# Patient Record
Sex: Female | Born: 1965 | Race: White | Hispanic: No | Marital: Married | Smoking: Never smoker
Health system: Southern US, Community
[De-identification: ages and names within clinical notes are randomized; demographics above are authoritative.]

## PROBLEM LIST (undated history)

## (undated) DIAGNOSIS — K219 Gastro-esophageal reflux disease without esophagitis: Secondary | ICD-10-CM

## (undated) DIAGNOSIS — F329 Major depressive disorder, single episode, unspecified: Secondary | ICD-10-CM

## (undated) DIAGNOSIS — I10 Essential (primary) hypertension: Secondary | ICD-10-CM

## (undated) DIAGNOSIS — F32A Depression, unspecified: Secondary | ICD-10-CM

## (undated) DIAGNOSIS — F319 Bipolar disorder, unspecified: Secondary | ICD-10-CM

## (undated) DIAGNOSIS — N6320 Unspecified lump in the left breast, unspecified quadrant: Secondary | ICD-10-CM

## (undated) HISTORY — PX: CHOLECYSTECTOMY: SHX55

---
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
## 2004-05-23 ENCOUNTER — Other Ambulatory Visit: Admission: RE | Admit: 2004-05-23 | Discharge: 2004-05-23 | Payer: Self-pay | Admitting: Obstetrics and Gynecology

## 2004-11-26 ENCOUNTER — Encounter: Admission: RE | Admit: 2004-11-26 | Discharge: 2004-11-26 | Payer: Self-pay | Admitting: Family Medicine

## 2005-06-23 ENCOUNTER — Other Ambulatory Visit: Admission: RE | Admit: 2005-06-23 | Discharge: 2005-06-23 | Payer: Self-pay | Admitting: Obstetrics and Gynecology

## 2006-02-22 ENCOUNTER — Emergency Department (HOSPITAL_COMMUNITY): Admission: EM | Admit: 2006-02-22 | Discharge: 2006-02-23 | Payer: Self-pay | Admitting: Emergency Medicine

## 2006-03-06 ENCOUNTER — Inpatient Hospital Stay (HOSPITAL_COMMUNITY): Admission: RE | Admit: 2006-03-06 | Discharge: 2006-03-07 | Payer: Self-pay

## 2006-03-16 ENCOUNTER — Ambulatory Visit: Payer: Self-pay | Admitting: Gastroenterology

## 2006-07-19 ENCOUNTER — Encounter: Admission: RE | Admit: 2006-07-19 | Discharge: 2006-07-19 | Payer: Self-pay | Admitting: Obstetrics and Gynecology

## 2012-05-31 ENCOUNTER — Other Ambulatory Visit (HOSPITAL_COMMUNITY): Payer: Self-pay | Admitting: Obstetrics and Gynecology

## 2012-05-31 DIAGNOSIS — E049 Nontoxic goiter, unspecified: Secondary | ICD-10-CM

## 2012-06-02 ENCOUNTER — Ambulatory Visit (HOSPITAL_COMMUNITY)
Admission: RE | Admit: 2012-06-02 | Discharge: 2012-06-02 | Disposition: A | Discharge status: Home / Self Care | Payer: Self-pay | Source: Ambulatory Visit | Attending: Obstetrics and Gynecology | Admitting: Obstetrics and Gynecology

## 2012-06-02 DIAGNOSIS — E049 Nontoxic goiter, unspecified: Secondary | ICD-10-CM

## 2012-06-02 DIAGNOSIS — R1310 Dysphagia, unspecified: Secondary | ICD-10-CM | POA: Insufficient documentation

## 2012-06-02 DIAGNOSIS — E042 Nontoxic multinodular goiter: Secondary | ICD-10-CM | POA: Insufficient documentation

## 2012-06-21 ENCOUNTER — Ambulatory Visit
Admission: RE | Admit: 2012-06-21 | Discharge: 2012-06-21 | Disposition: A | Discharge status: Home / Self Care | Payer: No Typology Code available for payment source | Source: Ambulatory Visit | Attending: Gastroenterology | Admitting: Gastroenterology

## 2012-06-21 ENCOUNTER — Other Ambulatory Visit: Payer: Self-pay | Admitting: Gastroenterology

## 2012-06-21 DIAGNOSIS — F458 Other somatoform disorders: Secondary | ICD-10-CM

## 2013-09-07 ENCOUNTER — Other Ambulatory Visit: Payer: Self-pay | Admitting: Obstetrics and Gynecology

## 2013-09-07 DIAGNOSIS — R928 Other abnormal and inconclusive findings on diagnostic imaging of breast: Secondary | ICD-10-CM

## 2013-09-14 ENCOUNTER — Ambulatory Visit
Admission: RE | Admit: 2013-09-14 | Discharge: 2013-09-14 | Disposition: A | Discharge status: Home / Self Care | Payer: Self-pay | Source: Ambulatory Visit | Attending: Obstetrics and Gynecology | Admitting: Obstetrics and Gynecology

## 2013-09-14 DIAGNOSIS — R928 Other abnormal and inconclusive findings on diagnostic imaging of breast: Secondary | ICD-10-CM

## 2014-08-13 ENCOUNTER — Other Ambulatory Visit: Payer: Self-pay | Admitting: Obstetrics and Gynecology

## 2014-08-13 DIAGNOSIS — R921 Mammographic calcification found on diagnostic imaging of breast: Secondary | ICD-10-CM

## 2014-09-17 ENCOUNTER — Ambulatory Visit
Admission: RE | Admit: 2014-09-17 | Discharge: 2014-09-17 | Disposition: A | Discharge status: Home / Self Care | Payer: BC Managed Care – PPO | Source: Ambulatory Visit | Attending: Obstetrics and Gynecology | Admitting: Obstetrics and Gynecology

## 2014-09-17 DIAGNOSIS — R921 Mammographic calcification found on diagnostic imaging of breast: Secondary | ICD-10-CM

## 2015-05-03 ENCOUNTER — Other Ambulatory Visit: Payer: Self-pay | Admitting: Family Medicine

## 2015-05-03 DIAGNOSIS — R1031 Right lower quadrant pain: Secondary | ICD-10-CM

## 2015-05-08 ENCOUNTER — Ambulatory Visit
Admission: RE | Admit: 2015-05-08 | Discharge: 2015-05-08 | Disposition: A | Discharge status: Home / Self Care | Payer: BLUE CROSS/BLUE SHIELD | Source: Ambulatory Visit | Attending: Family Medicine | Admitting: Family Medicine

## 2015-05-08 DIAGNOSIS — R1031 Right lower quadrant pain: Secondary | ICD-10-CM

## 2015-05-08 MED ORDER — IOPAMIDOL (ISOVUE-300) INJECTION 61%
100.0000 mL | Freq: Once | INTRAVENOUS | Status: AC | PRN
  Administered 2015-05-08: 100 mL via INTRAVENOUS

## 2015-08-23 ENCOUNTER — Other Ambulatory Visit: Payer: Self-pay | Admitting: Obstetrics and Gynecology

## 2015-08-23 DIAGNOSIS — R921 Mammographic calcification found on diagnostic imaging of breast: Secondary | ICD-10-CM

## 2015-09-23 ENCOUNTER — Ambulatory Visit
Admission: RE | Admit: 2015-09-23 | Discharge: 2015-09-23 | Disposition: A | Discharge status: Home / Self Care | Payer: BLUE CROSS/BLUE SHIELD | Source: Ambulatory Visit | Attending: Obstetrics and Gynecology | Admitting: Obstetrics and Gynecology

## 2015-09-23 DIAGNOSIS — R921 Mammographic calcification found on diagnostic imaging of breast: Secondary | ICD-10-CM

## 2018-05-19 ENCOUNTER — Other Ambulatory Visit: Payer: Self-pay | Admitting: Obstetrics and Gynecology

## 2018-05-19 DIAGNOSIS — Z1231 Encounter for screening mammogram for malignant neoplasm of breast: Secondary | ICD-10-CM

## 2018-06-13 ENCOUNTER — Ambulatory Visit
Admission: RE | Admit: 2018-06-13 | Discharge: 2018-06-13 | Disposition: A | Discharge status: Home / Self Care | Payer: PRIVATE HEALTH INSURANCE | Source: Ambulatory Visit | Attending: Obstetrics and Gynecology | Admitting: Obstetrics and Gynecology

## 2018-06-13 DIAGNOSIS — Z1231 Encounter for screening mammogram for malignant neoplasm of breast: Secondary | ICD-10-CM

## 2019-08-09 ENCOUNTER — Other Ambulatory Visit: Payer: Self-pay | Admitting: Obstetrics and Gynecology

## 2019-08-09 DIAGNOSIS — Z1231 Encounter for screening mammogram for malignant neoplasm of breast: Secondary | ICD-10-CM

## 2019-09-27 ENCOUNTER — Other Ambulatory Visit: Payer: Self-pay

## 2019-09-27 ENCOUNTER — Ambulatory Visit
Admission: RE | Admit: 2019-09-27 | Discharge: 2019-09-27 | Disposition: A | Discharge status: Home / Self Care | Payer: PRIVATE HEALTH INSURANCE | Source: Ambulatory Visit | Attending: Obstetrics and Gynecology | Admitting: Obstetrics and Gynecology

## 2019-09-27 DIAGNOSIS — Z1231 Encounter for screening mammogram for malignant neoplasm of breast: Secondary | ICD-10-CM

## 2019-09-28 ENCOUNTER — Other Ambulatory Visit: Payer: Self-pay | Admitting: Obstetrics and Gynecology

## 2019-09-28 DIAGNOSIS — R928 Other abnormal and inconclusive findings on diagnostic imaging of breast: Secondary | ICD-10-CM

## 2019-10-03 ENCOUNTER — Other Ambulatory Visit: Payer: Self-pay

## 2019-10-03 ENCOUNTER — Ambulatory Visit
Admission: RE | Admit: 2019-10-03 | Discharge: 2019-10-03 | Disposition: A | Discharge status: Home / Self Care | Payer: PRIVATE HEALTH INSURANCE | Source: Ambulatory Visit | Attending: Obstetrics and Gynecology | Admitting: Obstetrics and Gynecology

## 2019-10-03 ENCOUNTER — Other Ambulatory Visit: Payer: Self-pay | Admitting: Obstetrics and Gynecology

## 2019-10-03 DIAGNOSIS — N6320 Unspecified lump in the left breast, unspecified quadrant: Secondary | ICD-10-CM

## 2019-10-03 DIAGNOSIS — R928 Other abnormal and inconclusive findings on diagnostic imaging of breast: Secondary | ICD-10-CM

## 2019-10-05 ENCOUNTER — Other Ambulatory Visit: Payer: Self-pay

## 2019-10-05 ENCOUNTER — Ambulatory Visit
Admission: RE | Admit: 2019-10-05 | Discharge: 2019-10-05 | Disposition: A | Discharge status: Home / Self Care | Payer: PRIVATE HEALTH INSURANCE | Source: Ambulatory Visit | Attending: Obstetrics and Gynecology | Admitting: Obstetrics and Gynecology

## 2019-10-05 DIAGNOSIS — N6320 Unspecified lump in the left breast, unspecified quadrant: Secondary | ICD-10-CM

## 2019-11-21 ENCOUNTER — Ambulatory Visit: Payer: Self-pay | Admitting: General Surgery

## 2019-11-21 DIAGNOSIS — N6092 Unspecified benign mammary dysplasia of left breast: Secondary | ICD-10-CM

## 2019-11-22 ENCOUNTER — Other Ambulatory Visit: Payer: Self-pay | Admitting: General Surgery

## 2019-11-22 DIAGNOSIS — N6092 Unspecified benign mammary dysplasia of left breast: Secondary | ICD-10-CM

## 2019-12-29 ENCOUNTER — Other Ambulatory Visit: Payer: Self-pay

## 2019-12-29 ENCOUNTER — Encounter (HOSPITAL_BASED_OUTPATIENT_CLINIC_OR_DEPARTMENT_OTHER): Payer: Self-pay | Admitting: General Surgery

## 2020-01-01 ENCOUNTER — Other Ambulatory Visit (HOSPITAL_COMMUNITY): Payer: PRIVATE HEALTH INSURANCE

## 2020-01-01 ENCOUNTER — Encounter (HOSPITAL_BASED_OUTPATIENT_CLINIC_OR_DEPARTMENT_OTHER)
Admission: RE | Admit: 2020-01-01 | Discharge: 2020-01-01 | Disposition: A | Discharge status: Home / Self Care | Payer: PRIVATE HEALTH INSURANCE | Source: Ambulatory Visit | Attending: General Surgery | Admitting: General Surgery

## 2020-01-01 DIAGNOSIS — I10 Essential (primary) hypertension: Secondary | ICD-10-CM | POA: Diagnosis not present

## 2020-01-01 DIAGNOSIS — R9431 Abnormal electrocardiogram [ECG] [EKG]: Secondary | ICD-10-CM | POA: Diagnosis not present

## 2020-01-01 DIAGNOSIS — Z01810 Encounter for preprocedural cardiovascular examination: Secondary | ICD-10-CM | POA: Diagnosis not present

## 2020-01-01 NOTE — Progress Notes (Signed)

## 2020-01-03 ENCOUNTER — Ambulatory Visit
Admission: RE | Admit: 2020-01-03 | Discharge: 2020-01-03 | Disposition: A | Discharge status: Home / Self Care | Payer: PRIVATE HEALTH INSURANCE | Source: Ambulatory Visit | Attending: General Surgery | Admitting: General Surgery

## 2020-01-03 ENCOUNTER — Other Ambulatory Visit: Payer: Self-pay

## 2020-01-03 DIAGNOSIS — N6092 Unspecified benign mammary dysplasia of left breast: Secondary | ICD-10-CM

## 2020-01-04 ENCOUNTER — Ambulatory Visit (HOSPITAL_BASED_OUTPATIENT_CLINIC_OR_DEPARTMENT_OTHER): Payer: PRIVATE HEALTH INSURANCE | Admitting: Certified Registered"

## 2020-01-04 ENCOUNTER — Encounter (HOSPITAL_BASED_OUTPATIENT_CLINIC_OR_DEPARTMENT_OTHER)
Admission: RE | Disposition: A | Discharge status: Home / Self Care | Payer: Self-pay | Source: Home / Self Care | Attending: General Surgery

## 2020-01-04 ENCOUNTER — Ambulatory Visit
Admission: RE | Admit: 2020-01-04 | Discharge: 2020-01-04 | Disposition: A | Discharge status: Home / Self Care | Payer: PRIVATE HEALTH INSURANCE | Source: Ambulatory Visit | Attending: General Surgery | Admitting: General Surgery

## 2020-01-04 ENCOUNTER — Encounter (HOSPITAL_BASED_OUTPATIENT_CLINIC_OR_DEPARTMENT_OTHER): Payer: Self-pay | Admitting: General Surgery

## 2020-01-04 ENCOUNTER — Ambulatory Visit (HOSPITAL_BASED_OUTPATIENT_CLINIC_OR_DEPARTMENT_OTHER)
Admission: RE | Admit: 2020-01-04 | Discharge: 2020-01-04 | Disposition: A | Discharge status: Home / Self Care | Payer: PRIVATE HEALTH INSURANCE | Attending: General Surgery | Admitting: General Surgery

## 2020-01-04 ENCOUNTER — Other Ambulatory Visit: Payer: Self-pay

## 2020-01-04 DIAGNOSIS — Z803 Family history of malignant neoplasm of breast: Secondary | ICD-10-CM | POA: Diagnosis not present

## 2020-01-04 DIAGNOSIS — Z79899 Other long term (current) drug therapy: Secondary | ICD-10-CM | POA: Diagnosis not present

## 2020-01-04 DIAGNOSIS — N6092 Unspecified benign mammary dysplasia of left breast: Secondary | ICD-10-CM | POA: Insufficient documentation

## 2020-01-04 DIAGNOSIS — D242 Benign neoplasm of left breast: Secondary | ICD-10-CM | POA: Diagnosis not present

## 2020-01-04 DIAGNOSIS — Z8249 Family history of ischemic heart disease and other diseases of the circulatory system: Secondary | ICD-10-CM | POA: Diagnosis not present

## 2020-01-04 DIAGNOSIS — I10 Essential (primary) hypertension: Secondary | ICD-10-CM | POA: Insufficient documentation

## 2020-01-04 DIAGNOSIS — F319 Bipolar disorder, unspecified: Secondary | ICD-10-CM | POA: Diagnosis not present

## 2020-01-04 DIAGNOSIS — Z885 Allergy status to narcotic agent status: Secondary | ICD-10-CM | POA: Diagnosis not present

## 2020-01-04 HISTORY — DX: Unspecified lump in the left breast, unspecified quadrant: N63.20

## 2020-01-04 HISTORY — DX: Gastro-esophageal reflux disease without esophagitis: K21.9

## 2020-01-04 HISTORY — DX: Depression, unspecified: F32.A

## 2020-01-04 HISTORY — PX: BREAST LUMPECTOMY WITH RADIOACTIVE SEED LOCALIZATION: SHX6424

## 2020-01-04 HISTORY — DX: Bipolar disorder, unspecified: F31.9

## 2020-01-04 HISTORY — DX: Essential (primary) hypertension: I10

## 2020-01-04 SURGERY — BREAST LUMPECTOMY WITH RADIOACTIVE SEED LOCALIZATION
Anesthesia: General | Site: Breast | Laterality: Left

## 2020-01-04 MED ORDER — CELECOXIB 200 MG PO CAPS
ORAL_CAPSULE | ORAL | Status: AC
  Filled 2020-01-04: qty 1

## 2020-01-04 MED ORDER — LACTATED RINGERS IV SOLN: INTRAVENOUS | Status: DC

## 2020-01-04 MED ORDER — MIDAZOLAM HCL 2 MG/2ML IJ SOLN
INTRAMUSCULAR | Status: AC
  Filled 2020-01-04: qty 2

## 2020-01-04 MED ORDER — DEXAMETHASONE SODIUM PHOSPHATE 10 MG/ML IJ SOLN
INTRAMUSCULAR | Status: DC | PRN
  Administered 2020-01-04: 10 mg via INTRAVENOUS

## 2020-01-04 MED ORDER — ROCURONIUM BROMIDE 10 MG/ML (PF) SYRINGE
PREFILLED_SYRINGE | INTRAVENOUS | Status: AC
  Filled 2020-01-04: qty 10

## 2020-01-04 MED ORDER — MIDAZOLAM HCL 5 MG/5ML IJ SOLN
INTRAMUSCULAR | Status: DC | PRN
  Administered 2020-01-04: 2 mg via INTRAVENOUS

## 2020-01-04 MED ORDER — LIDOCAINE HCL (CARDIAC) PF 100 MG/5ML IV SOSY
PREFILLED_SYRINGE | INTRAVENOUS | Status: DC | PRN
  Administered 2020-01-04: 100 mg via INTRAVENOUS

## 2020-01-04 MED ORDER — FENTANYL CITRATE (PF) 100 MCG/2ML IJ SOLN
INTRAMUSCULAR | Status: AC
  Filled 2020-01-04: qty 2

## 2020-01-04 MED ORDER — CEFAZOLIN SODIUM-DEXTROSE 2-4 GM/100ML-% IV SOLN
INTRAVENOUS | Status: AC
  Filled 2020-01-04: qty 100

## 2020-01-04 MED ORDER — CHLORHEXIDINE GLUCONATE CLOTH 2 % EX PADS: 6.0000 | MEDICATED_PAD | Freq: Once | CUTANEOUS | Status: DC

## 2020-01-04 MED ORDER — ONDANSETRON HCL 4 MG/2ML IJ SOLN
INTRAMUSCULAR | Status: AC
  Filled 2020-01-04: qty 2

## 2020-01-04 MED ORDER — ACETAMINOPHEN 500 MG PO TABS
1000.0000 mg | ORAL_TABLET | ORAL | Status: AC
  Administered 2020-01-04: 1000 mg via ORAL

## 2020-01-04 MED ORDER — GABAPENTIN 300 MG PO CAPS
ORAL_CAPSULE | ORAL | Status: AC
  Filled 2020-01-04: qty 1

## 2020-01-04 MED ORDER — BUPIVACAINE HCL (PF) 0.25 % IJ SOLN
INTRAMUSCULAR | Status: AC
  Filled 2020-01-04: qty 30

## 2020-01-04 MED ORDER — FENTANYL CITRATE (PF) 100 MCG/2ML IJ SOLN: 50.0000 ug | INTRAMUSCULAR | Status: DC | PRN

## 2020-01-04 MED ORDER — CELECOXIB 200 MG PO CAPS
200.0000 mg | ORAL_CAPSULE | ORAL | Status: AC
  Administered 2020-01-04: 200 mg via ORAL

## 2020-01-04 MED ORDER — EPHEDRINE SULFATE 50 MG/ML IJ SOLN
INTRAMUSCULAR | Status: DC | PRN
  Administered 2020-01-04: 5 mg via INTRAVENOUS
  Administered 2020-01-04: 10 mg via INTRAVENOUS
  Administered 2020-01-04: 5 mg via INTRAVENOUS

## 2020-01-04 MED ORDER — GABAPENTIN 300 MG PO CAPS
300.0000 mg | ORAL_CAPSULE | ORAL | Status: AC
  Administered 2020-01-04: 300 mg via ORAL

## 2020-01-04 MED ORDER — PHENYLEPHRINE 40 MCG/ML (10ML) SYRINGE FOR IV PUSH (FOR BLOOD PRESSURE SUPPORT)
PREFILLED_SYRINGE | INTRAVENOUS | Status: AC
  Filled 2020-01-04: qty 10

## 2020-01-04 MED ORDER — ONDANSETRON HCL 4 MG/2ML IJ SOLN
INTRAMUSCULAR | Status: DC | PRN
  Administered 2020-01-04: 4 mg via INTRAVENOUS

## 2020-01-04 MED ORDER — ACETAMINOPHEN 500 MG PO TABS
ORAL_TABLET | ORAL | Status: AC
  Filled 2020-01-04: qty 2

## 2020-01-04 MED ORDER — ONDANSETRON HCL 4 MG/2ML IJ SOLN
INTRAMUSCULAR | Status: AC
  Filled 2020-01-04: qty 4

## 2020-01-04 MED ORDER — MIDAZOLAM HCL 2 MG/2ML IJ SOLN: 1.0000 mg | INTRAMUSCULAR | Status: DC | PRN

## 2020-01-04 MED ORDER — FENTANYL CITRATE (PF) 100 MCG/2ML IJ SOLN
INTRAMUSCULAR | Status: DC | PRN
  Administered 2020-01-04: 100 ug via INTRAVENOUS

## 2020-01-04 MED ORDER — 0.9 % SODIUM CHLORIDE (POUR BTL) OPTIME
TOPICAL | Status: DC | PRN
  Administered 2020-01-04: 1000 mL

## 2020-01-04 MED ORDER — CEFAZOLIN SODIUM-DEXTROSE 2-4 GM/100ML-% IV SOLN
2.0000 g | INTRAVENOUS | Status: AC
  Administered 2020-01-04: 2 g via INTRAVENOUS

## 2020-01-04 MED ORDER — PROPOFOL 10 MG/ML IV BOLUS
INTRAVENOUS | Status: DC | PRN
  Administered 2020-01-04: 150 mg via INTRAVENOUS

## 2020-01-04 MED ORDER — EPHEDRINE 5 MG/ML INJ
INTRAVENOUS | Status: AC
  Filled 2020-01-04: qty 10

## 2020-01-04 MED ORDER — BUPIVACAINE HCL 0.25 % IJ SOLN
INTRAMUSCULAR | Status: DC | PRN
  Administered 2020-01-04: 20 mL

## 2020-01-04 MED ORDER — TRAMADOL HCL 50 MG PO TABS: 50.0000 mg | ORAL_TABLET | Freq: Four times a day (QID) | ORAL | 0 refills | Status: AC | PRN

## 2020-01-04 MED ORDER — DEXTROSE 50 % IV SOLN
INTRAVENOUS | Status: AC
  Filled 2020-01-04: qty 50

## 2020-01-04 SURGICAL SUPPLY — 43 items
ADH SKN CLS APL DERMABOND .7 (GAUZE/BANDAGES/DRESSINGS) ×1
APL PRP STRL LF DISP 70% ISPRP (MISCELLANEOUS) ×1
APPLIER CLIP 9.375 MED OPEN (MISCELLANEOUS)
APR CLP MED 9.3 20 MLT OPN (MISCELLANEOUS)
BLADE SURG 15 STRL LF DISP TIS (BLADE) ×1 IMPLANT
BLADE SURG 15 STRL SS (BLADE) ×3
CANISTER SUC SOCK COL 7IN (MISCELLANEOUS) ×3 IMPLANT
CANISTER SUCT 1200ML W/VALVE (MISCELLANEOUS) ×3 IMPLANT
CHLORAPREP W/TINT 26 (MISCELLANEOUS) ×3 IMPLANT
CLIP APPLIE 9.375 MED OPEN (MISCELLANEOUS) IMPLANT
COVER BACK TABLE 60X90IN (DRAPES) ×3 IMPLANT
COVER MAYO STAND STRL (DRAPES) ×3 IMPLANT
COVER PROBE W GEL 5X96 (DRAPES) ×3 IMPLANT
COVER WAND RF STERILE (DRAPES) IMPLANT
DECANTER SPIKE VIAL GLASS SM (MISCELLANEOUS) IMPLANT
DERMABOND ADVANCED (GAUZE/BANDAGES/DRESSINGS) ×2
DERMABOND ADVANCED .7 DNX12 (GAUZE/BANDAGES/DRESSINGS) ×1 IMPLANT
DRAPE LAPAROSCOPIC ABDOMINAL (DRAPES) ×3 IMPLANT
DRAPE UTILITY XL STRL (DRAPES) ×3 IMPLANT
ELECT COATED BLADE 2.86 ST (ELECTRODE) ×3 IMPLANT
ELECT REM PT RETURN 9FT ADLT (ELECTROSURGICAL) ×3
ELECTRODE REM PT RTRN 9FT ADLT (ELECTROSURGICAL) ×1 IMPLANT
GLOVE BIO SURGEON STRL SZ7.5 (GLOVE) ×6 IMPLANT
GOWN STRL REUS W/ TWL LRG LVL3 (GOWN DISPOSABLE) ×2 IMPLANT
GOWN STRL REUS W/TWL LRG LVL3 (GOWN DISPOSABLE) ×6
ILLUMINATOR WAVEGUIDE N/F (MISCELLANEOUS) IMPLANT
KIT MARKER MARGIN INK (KITS) ×3 IMPLANT
LIGHT WAVEGUIDE WIDE FLAT (MISCELLANEOUS) IMPLANT
NEEDLE HYPO 25X1 1.5 SAFETY (NEEDLE) IMPLANT
NS IRRIG 1000ML POUR BTL (IV SOLUTION) IMPLANT
PACK BASIN DAY SURGERY FS (CUSTOM PROCEDURE TRAY) ×3 IMPLANT
PENCIL SMOKE EVACUATOR (MISCELLANEOUS) ×3 IMPLANT
SLEEVE SCD COMPRESS KNEE MED (MISCELLANEOUS) ×3 IMPLANT
SPONGE LAP 18X18 RF (DISPOSABLE) ×3 IMPLANT
SUT MON AB 4-0 PC3 18 (SUTURE) ×3 IMPLANT
SUT SILK 2 0 SH (SUTURE) IMPLANT
SUT VICRYL 3-0 CR8 SH (SUTURE) ×3 IMPLANT
SYR CONTROL 10ML LL (SYRINGE) IMPLANT
TOWEL GREEN STERILE FF (TOWEL DISPOSABLE) ×3 IMPLANT
TRAY FAXITRON CT DISP (TRAY / TRAY PROCEDURE) ×3 IMPLANT
TUBE CONNECTING 20'X1/4 (TUBING) ×1
TUBE CONNECTING 20X1/4 (TUBING) ×2 IMPLANT
YANKAUER SUCT BULB TIP NO VENT (SUCTIONS) IMPLANT

## 2020-01-04 NOTE — Transfer of Care (Signed)
Immediate Anesthesia Transfer of Care Note  Patient: Sara Ortiz  Procedure(s) Performed: RADIOACTIVE SEED GUIDED LEFT BREAST LUMPECTOMY (Left Breast)  Patient Location: PACU  Anesthesia Type:General  Level of Consciousness: awake, alert  and oriented  Airway & Oxygen Therapy: Patient Spontanous Breathing and Patient connected to face mask oxygen  Post-op Assessment: Report given to RN and Post -op Vital signs reviewed and stable  Post vital signs: Reviewed and stable  Last Vitals:  Vitals Value Taken Time  BP 133/77 01/04/20 1000  Temp    Pulse 100 01/04/20 1000  Resp 17 01/04/20 1000  SpO2 100 % 01/04/20 1000  Vitals shown include unvalidated device data.  Last Pain:  Vitals:   01/04/20 0812  TempSrc: Tympanic  PainSc: 0-No pain      Patients Stated Pain Goal: 8 ([id number])  Complications: No apparent anesthesia complications

## 2020-01-04 NOTE — H&P (Signed)
Sara Ortiz  Location: [facility] Surgery Patient #: [id number] DOB: [birth year] Married / Language: English / Race: White Female   History of Present Illness  The patient is a 54 year old female who presents with a breast mass. We are asked to see the patient in consultation by Dr. Louretta Shorten to evaluate her for a mass in the left breast. The patient is a 54 year old white female who recently went for a routine screening mammogram. At that time she was found to have a 9 mm mass in the upper outer quadrant of the left breast. Her mammograms were reviewed. The mass was biopsied and came back as a fibroepithelial lesion with epithelial atypia consistent with atypical duct hyperplasia. She denies any breast pain or discharge from the nipple. Her only family history of breast cancer is in a paternal aunt. She does not smoke. She does not take hormone replacement. She is otherwise in good health.   Past Surgical History  Breast Biopsy  Left. Cesarean Section - 1  Gallbladder Surgery - Laparoscopic   Diagnostic Studies History Colonoscopy  1-5 years ago Mammogram  within last year Pap Smear  1-5 years ago  Allergies  CODEINE   Medication History  lamoTRIgine (100MG  Tablet, Oral) Active. Losartan Potassium (100MG  Tablet, Oral) Active. Medications Reconciled  Social History  Alcohol use  Occasional alcohol use. Caffeine use  Coffee. No drug use  Tobacco use  Never smoker.  Family History  Alcohol Abuse  Father. Bleeding disorder  Family Members In General. Depression  Mother, Sister. Heart Disease  Family Members In General, Father. Hypertension  Father, Mother. Migraine Headache  Mother, Sister.  Pregnancy / Birth History  Age at menarche  36 years. Age of menopause  [age] Contraceptive History  Intrauterine device, Oral contraceptives. Gravida  1 Irregular periods  Length (months) of breastfeeding  7-12 Maternal age  93-25 Para   1  Other Problems  Anxiety Disorder  Cholelithiasis  Depression  Gastroesophageal Reflux Disease  Hemorrhoids  High blood pressure  Kidney Stone  Migraine Headache     Review of Systems  General Not Present- Appetite Loss, Chills, Fatigue, Fever, Night Sweats, Weight Gain and Weight Loss. HEENT Present- Wears glasses/contact lenses. Not Present- Earache, Hearing Loss, Hoarseness, Nose Bleed, Oral Ulcers, Ringing in the Ears, Seasonal Allergies, Sinus Pain, Sore Throat, Visual Disturbances and Yellow Eyes. Respiratory Not Present- Bloody sputum, Chronic Cough, Difficulty Breathing, Snoring and Wheezing. Breast Present- Breast Mass. Not Present- Breast Pain, Nipple Discharge and Skin Changes. Cardiovascular Not Present- Chest Pain, Difficulty Breathing Lying Down, Leg Cramps, Palpitations, Rapid Heart Rate, Shortness of Breath and Swelling of Extremities. Gastrointestinal Not Present- Abdominal Pain, Bloating, Bloody Stool, Change in Bowel Habits, Chronic diarrhea, Constipation, Difficulty Swallowing, Excessive gas, Gets full quickly at meals, Hemorrhoids, Indigestion, Nausea, Rectal Pain and Vomiting. Female Genitourinary Not Present- Frequency, Nocturia, Painful Urination, Pelvic Pain and Urgency. Musculoskeletal Not Present- Back Pain, Joint Pain, Joint Stiffness, Muscle Pain, Muscle Weakness and Swelling of Extremities. Neurological Not Present- Decreased Memory, Fainting, Headaches, Numbness, Seizures, Tingling, Tremor, Trouble walking and Weakness. Psychiatric Present- Depression. Not Present- Anxiety, Bipolar, Change in Sleep Pattern, Fearful and Frequent crying. Endocrine Present- Hot flashes. Not Present- Cold Intolerance, Excessive Hunger, Hair Changes, Heat Intolerance and New Diabetes. Hematology Not Present- Blood Thinners, Easy Bruising, Excessive bleeding, Gland problems, HIV and Persistent Infections.  Vitals Weight: 183.5 lb Height: 64in Body Surface Area:  1.89 m Body Mass Index: 31.5 kg/m  Temp.: 97.53F  Pulse: 94 (Regular)  P.OX: 99% (Room air) BP: 124/78 (Sitting, Left Arm, Standard)       Physical Exam General Mental Status-Alert. General Appearance-Consistent with stated age. Hydration-Well hydrated. Voice-Normal.  Head and Neck Head-normocephalic, atraumatic with no lesions or palpable masses. Trachea-midline. Thyroid Gland Characteristics - normal size and consistency.  Eye Eyeball - Bilateral-Extraocular movements intact. Sclera/Conjunctiva - Bilateral-No scleral icterus.  Chest and Lung Exam Chest and lung exam reveals -quiet, even and easy respiratory effort with no use of accessory muscles and on auscultation, normal breath sounds, no adventitious sounds and normal vocal resonance. Inspection Chest Wall - Normal. Back - normal.  Breast Note: There is no palpable mass in either breast. There is no palpable axillary, supraclavicular, or cervical lymphadenopathy.   Cardiovascular Cardiovascular examination reveals -normal heart sounds, regular rate and rhythm with no murmurs and normal pedal pulses bilaterally.  Abdomen Inspection Inspection of the abdomen reveals - No Hernias. Skin - Scar - no surgical scars. Palpation/Percussion Palpation and Percussion of the abdomen reveal - Soft, Non Tender, No Rebound tenderness, No Rigidity (guarding) and No hepatosplenomegaly. Auscultation Auscultation of the abdomen reveals - Bowel sounds normal.  Neurologic Neurologic evaluation reveals -alert and oriented x 3 with no impairment of recent or remote memory. Mental Status-Normal.  Musculoskeletal Normal Exam - Left-Upper Extremity Strength Normal and Lower Extremity Strength Normal. Normal Exam - Right-Upper Extremity Strength Normal and Lower Extremity Strength Normal.  Lymphatic Head & Neck  General Head & Neck Lymphatics: Bilateral - Description -  Normal. Axillary  General Axillary Region: Bilateral - Description - Normal. Tenderness - Non Tender. Femoral & Inguinal  Generalized Femoral & Inguinal Lymphatics: Bilateral - Description - Normal. Tenderness - Non Tender.    Assessment & Plan  ATYPICAL DUCTAL HYPERPLASIA OF LEFT BREAST (N60.92) Impression: The patient appears to have a 9 mm area of atypical ductal hyperplasia in the upper outer quadrant of the left breast. Because of its appearance and because it is considered a high risk lesion I would recommend that this area be removed. She would also like to have this done. I have discussed with her in detail the risks and benefits of the operation as well as some of the technical aspects including the use of a radioactive seed for localization and she understands and wishes to proceed. Since this diagnosis carries a lifetime breast cancer risk of around 30% I will also refer her to the high-risk clinic at the cancer center to talk about risk reduction. Given this level and wrist she will also likely be a candidate for yearly MRI as part of her screening imaging Current Plans Referred to Oncology, for evaluation and follow up (Oncology). Routine.

## 2020-01-04 NOTE — Discharge Instructions (Signed)
°  Post Anesthesia Home Care Instructions  Activity: Get plenty of rest for the remainder of the day. A responsible individual must stay with you for 24 hours following the procedure.  For the next 24 hours, DO NOT: -Drive a car -Paediatric nurse -Drink alcoholic beverages -Take any medication unless instructed by your physician -Make any legal decisions or sign important papers.  Meals: Start with liquid foods such as gelatin or soup. Progress to regular foods as tolerated. Avoid greasy, spicy, heavy foods. If nausea and/or vomiting occur, drink only clear liquids until the nausea and/or vomiting subsides. Call your physician if vomiting continues.  Special Instructions/Symptoms: Your throat may feel dry or sore from the anesthesia or the breathing tube placed in your throat during surgery. If this causes discomfort, gargle with warm salt water. The discomfort should disappear within 24 hours.  If you had a scopolamine patch placed behind your ear for the management of post- operative nausea and/or vomiting:  1. The medication in the patch is effective for 72 hours, after which it should be removed.  Wrap patch in a tissue and discard in the trash. Wash hands thoroughly with soap and water. 2. You may remove the patch earlier than 72 hours if you experience unpleasant side effects which may include dry mouth, dizziness or visual disturbances. 3. Avoid touching the patch. Wash your hands with soap and water after contact with the patch.     Call your surgeon if you experience:   1.  Fever over 101.0. 2.  Inability to urinate. 3.  Nausea and/or vomiting. 4.  Extreme swelling or bruising at the surgical site. 5.  Continued bleeding from the incision. 6.  Increased pain, redness or drainage from the incision. 7.  Problems related to your pain medication. 8.  Any problems and/or concerns  May take Tylenol after 2pm May take Ibuprofen after 4pm

## 2020-01-04 NOTE — Op Note (Signed)
01/04/2020  9:50 AM  PATIENT:  Sara Ortiz  54 y.o. female  PRE-OPERATIVE DIAGNOSIS:  LEFT BREAST ATYPICAL DUCTAL HYPERPLASIA  POST-OPERATIVE DIAGNOSIS:  LEFT BREAST ATYPICAL DUCTAL HYPERPLASIA  PROCEDURE:  Procedure(s): RADIOACTIVE SEED GUIDED LEFT BREAST LUMPECTOMY (Left)  SURGEON:  Surgeon(s) and Role:    * Jovita Kussmaul, MD - Primary  PHYSICIAN ASSISTANT:   ASSISTANTS: none   ANESTHESIA:   local and general  EBL:  5 mL   BLOOD ADMINISTERED:none  DRAINS: none   LOCAL MEDICATIONS USED:  MARCAINE     SPECIMEN:  Source of Specimen:  left breast tissue  DISPOSITION OF SPECIMEN:  PATHOLOGY  COUNTS:  YES  TOURNIQUET:  * No tourniquets in log *  DICTATION: .Dragon Dictation   After informed consent was obtained the patient was brought to the operating room and placed in the supine position on the operating table.  After adequate induction of general anesthesia the patient's left breast was prepped with ChloraPrep, allowed to dry, and draped in usual sterile manner.  An appropriate timeout was performed.  Previously an I-125 seed was placed in the lateral aspect of the left breast to mark an area of atypical ductal hyperplasia.  The neoprobe was set to I-125 in the area of radioactivity was readily identified.  The area around this was infiltrated with quarter percent Marcaine.  A small vertically oriented curvilinear incision was made along the outer edge of the breast with a 15 blade knife.  The incision was carried through the skin and subcutaneous tissue sharply with the electrocautery.  Dissection was then carried out through the breast tissue towards the radioactive seed under the direction of the neoprobe.  Once I more closely approached the radioactive seed I then removed a circular portion of breast tissue sharply with the electrocautery around the radioactive seed while checking the area of radioactivity frequently.  Once the specimen was removed it was oriented with  the appropriate paint colors.  A specimen radiograph was obtained that showed the clip and seed to be near the center of the specimen.  The specimen was then sent to pathology for further evaluation.  Hemostasis was achieved using the Bovie electrocautery.  The wound was irrigated with saline and infiltrated with more quarter percent Marcaine.  The deep layer of the wound was then closed with layers of interrupted 3-0 Vicryl stitches.  The skin was then closed with a running 4-0 Monocryl subcuticular stitch.  Dermabond dressings were applied.  The patient tolerated the procedure well.  At the end of the case all needle sponge and instrument counts were correct.  The patient was then awakened and taken to recovery in stable condition.  PLAN OF CARE: Discharge to home after PACU  PATIENT DISPOSITION:  PACU - hemodynamically stable.   Delay start of Pharmacological VTE agent (>24hrs) due to surgical blood loss or risk of bleeding: not applicable

## 2020-01-04 NOTE — Interval H&P Note (Signed)
History and Physical Interval Note:  01/04/2020 8:32 AM  Sara Ortiz  has presented today for surgery, with the diagnosis of LEFT BREAST ATYPICAL DUCTAL HYPERPLASIA.  The various methods of treatment have been discussed with the patient and family. After consideration of risks, benefits and other options for treatment, the patient has consented to  Procedure(s): LEFT BREAST LUMPECTOMY WITH RADIOACTIVE SEED LOCALIZATION (Left) as a surgical intervention.  The patient's history has been reviewed, patient examined, no change in status, stable for surgery.  I have reviewed the patient's chart and labs.  Questions were answered to the patient's satisfaction.     Autumn Messing III

## 2020-01-04 NOTE — Anesthesia Preprocedure Evaluation (Signed)
Anesthesia Evaluation  Patient identified by MRN, date of birth, ID band Patient awake    Reviewed: Allergy & Precautions, NPO status , Patient's Chart, lab work & pertinent test results  Airway Mallampati: II  TM Distance: >3 FB Neck ROM: Full    Dental no notable dental hx.    Pulmonary neg pulmonary ROS,    Pulmonary exam normal breath sounds clear to auscultation       Cardiovascular hypertension, Pt. on medications Normal cardiovascular exam Rhythm:Regular Rate:Normal     Neuro/Psych PSYCHIATRIC DISORDERS Depression Bipolar Disorder negative neurological ROS     GI/Hepatic Neg liver ROS, GERD  ,  Endo/Other  negative endocrine ROS  Renal/GU negative Renal ROS     Musculoskeletal negative musculoskeletal ROS (+)   Abdominal (+) + obese,   Peds  Hematology negative hematology ROS (+)   Anesthesia Other Findings   Reproductive/Obstetrics negative OB ROS                             Anesthesia Physical Anesthesia Plan  ASA: II  Anesthesia Plan: General   Post-op Pain Management:    Induction: Intravenous  PONV Risk Score and Plan: 4 or greater and Ondansetron, Dexamethasone, Midazolam, Scopolamine patch - Pre-op and Treatment may vary due to age or medical condition  Airway Management Planned: LMA  Additional Equipment:   Intra-op Plan:   Post-operative Plan: Extubation in OR  Informed Consent: I have reviewed the patients History and Physical, chart, labs and discussed the procedure including the risks, benefits and alternatives for the proposed anesthesia with the patient or authorized representative who has indicated his/her understanding and acceptance.     Dental advisory given  Plan Discussed with: CRNA  Anesthesia Plan Comments:         Anesthesia Quick Evaluation

## 2020-01-04 NOTE — Anesthesia Postprocedure Evaluation (Signed)
Anesthesia Post Note  Patient: Sara Ortiz  Procedure(s) Performed: RADIOACTIVE SEED GUIDED LEFT BREAST LUMPECTOMY (Left Breast)     Patient location during evaluation: PACU Anesthesia Type: General Level of consciousness: sedated and patient cooperative Pain management: pain level controlled Vital Signs Assessment: post-procedure vital signs reviewed and stable Respiratory status: spontaneous breathing Cardiovascular status: stable Anesthetic complications: no    Last Vitals:  Vitals:   01/04/20 1030 01/04/20 1051  BP: 138/80 140/80  Pulse: 76 84  Resp: 15 18  Temp:  (!) 36.3 C  SpO2: 100% 100%    Last Pain:  Vitals:   01/04/20 1051  TempSrc: Oral  PainSc: 0-No pain                 Nolon Nations

## 2020-01-04 NOTE — Anesthesia Procedure Notes (Addendum)
Procedure Name: LMA Insertion Date/Time: 01/04/2020 9:20 AM Performed by: Marrianne Mood, CRNA Pre-anesthesia Checklist: Patient identified, Emergency Drugs available, Suction available, Patient being monitored and Timeout performed Patient Re-evaluated:Patient Re-evaluated prior to induction Oxygen Delivery Method: Circle system utilized Preoxygenation: Pre-oxygenation with 100% oxygen Induction Type: IV induction Ventilation: Mask ventilation without difficulty LMA: LMA inserted LMA Size: 4.0 Number of attempts: 1 Airway Equipment and Method: Bite block Placement Confirmation: positive ETCO2 Tube secured with: Tape Dental Injury: Teeth and Oropharynx as per pre-operative assessment

## 2020-01-05 ENCOUNTER — Encounter: Payer: Self-pay | Admitting: *Deleted

## 2020-01-05 LAB — SURGICAL PATHOLOGY

## 2020-11-20 ENCOUNTER — Encounter (HOSPITAL_COMMUNITY): Payer: Self-pay

## 2021-08-06 ENCOUNTER — Other Ambulatory Visit: Payer: Self-pay | Admitting: Obstetrics and Gynecology

## 2021-08-06 DIAGNOSIS — Z1231 Encounter for screening mammogram for malignant neoplasm of breast: Secondary | ICD-10-CM

## 2021-09-04 ENCOUNTER — Ambulatory Visit
Admission: RE | Admit: 2021-09-04 | Discharge: 2021-09-04 | Disposition: A | Discharge status: Home / Self Care | Payer: PRIVATE HEALTH INSURANCE | Source: Ambulatory Visit | Attending: Obstetrics and Gynecology | Admitting: Obstetrics and Gynecology

## 2021-09-04 DIAGNOSIS — Z1231 Encounter for screening mammogram for malignant neoplasm of breast: Secondary | ICD-10-CM

## 2022-08-10 ENCOUNTER — Other Ambulatory Visit: Payer: Self-pay | Admitting: Obstetrics and Gynecology

## 2022-08-10 DIAGNOSIS — Z1231 Encounter for screening mammogram for malignant neoplasm of breast: Secondary | ICD-10-CM

## 2022-09-24 ENCOUNTER — Ambulatory Visit
Admission: RE | Admit: 2022-09-24 | Discharge: 2022-09-24 | Disposition: A | Discharge status: Home / Self Care | Payer: PRIVATE HEALTH INSURANCE | Source: Ambulatory Visit | Attending: Obstetrics and Gynecology | Admitting: Obstetrics and Gynecology

## 2022-09-24 DIAGNOSIS — Z1231 Encounter for screening mammogram for malignant neoplasm of breast: Secondary | ICD-10-CM

## 2023-01-27 IMAGING — MG MM DIGITAL SCREENING BILAT W/ TOMO AND CAD
6 of 10 series · 6 of 30 positions shown · non-contrast
Comparison: Previous exam(s).

CLINICAL DATA: Screening.

EXAM:
DIGITAL SCREENING BILATERAL MAMMOGRAM WITH TOMOSYNTHESIS AND CAD
TECHNIQUE: Bilateral screening digital craniocaudal and mediolateral oblique
mammograms were obtained. Bilateral screening digital breast
tomosynthesis was performed. The images were evaluated with
computer-aided detection.

[L MLO synth-2D]
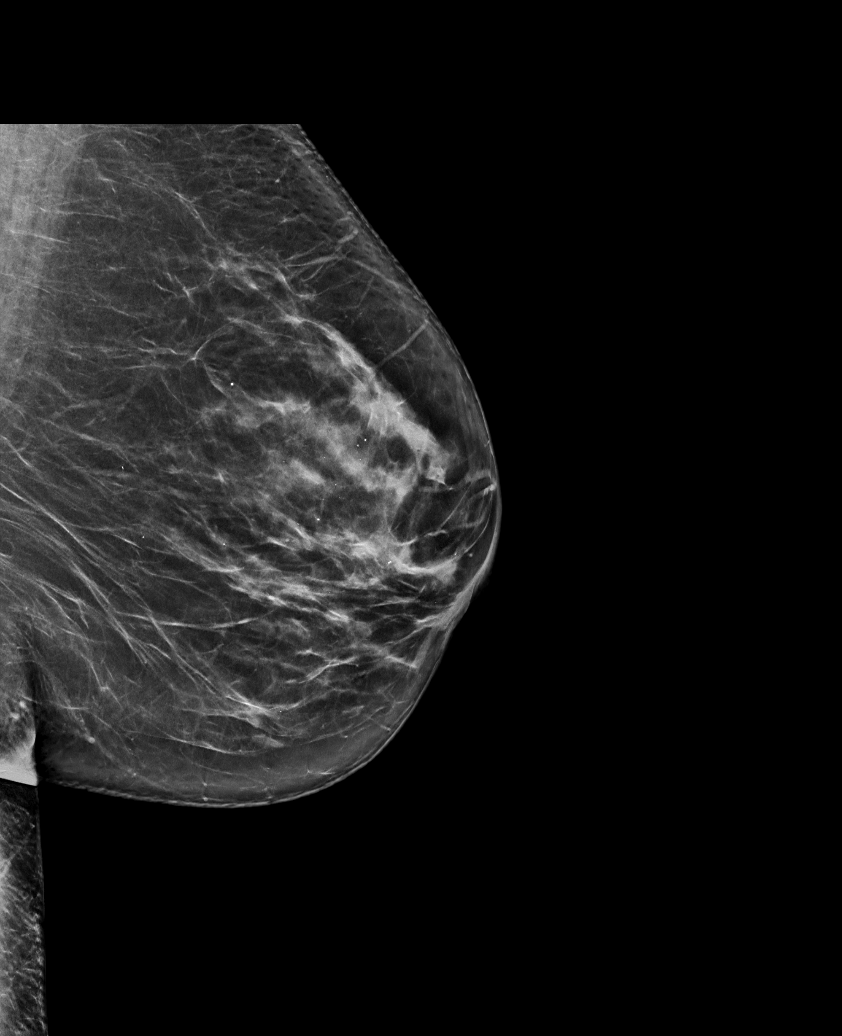

[L CC synth-2D]
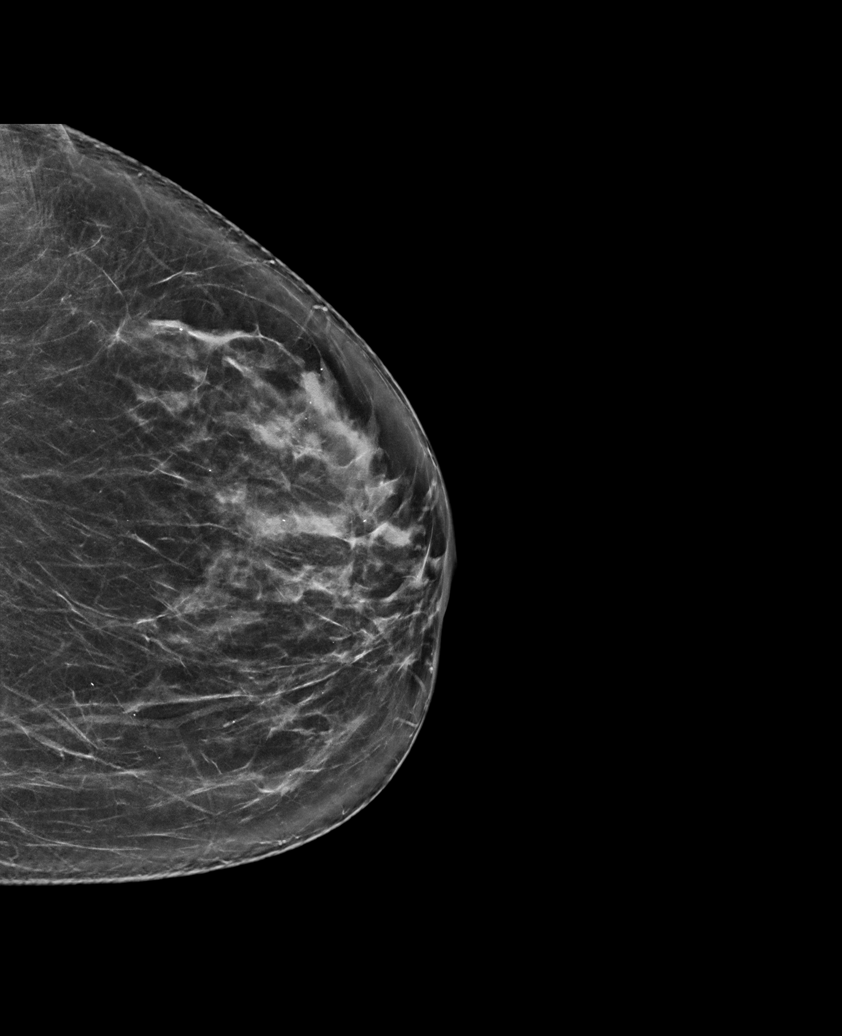

[R CC synth-2D]
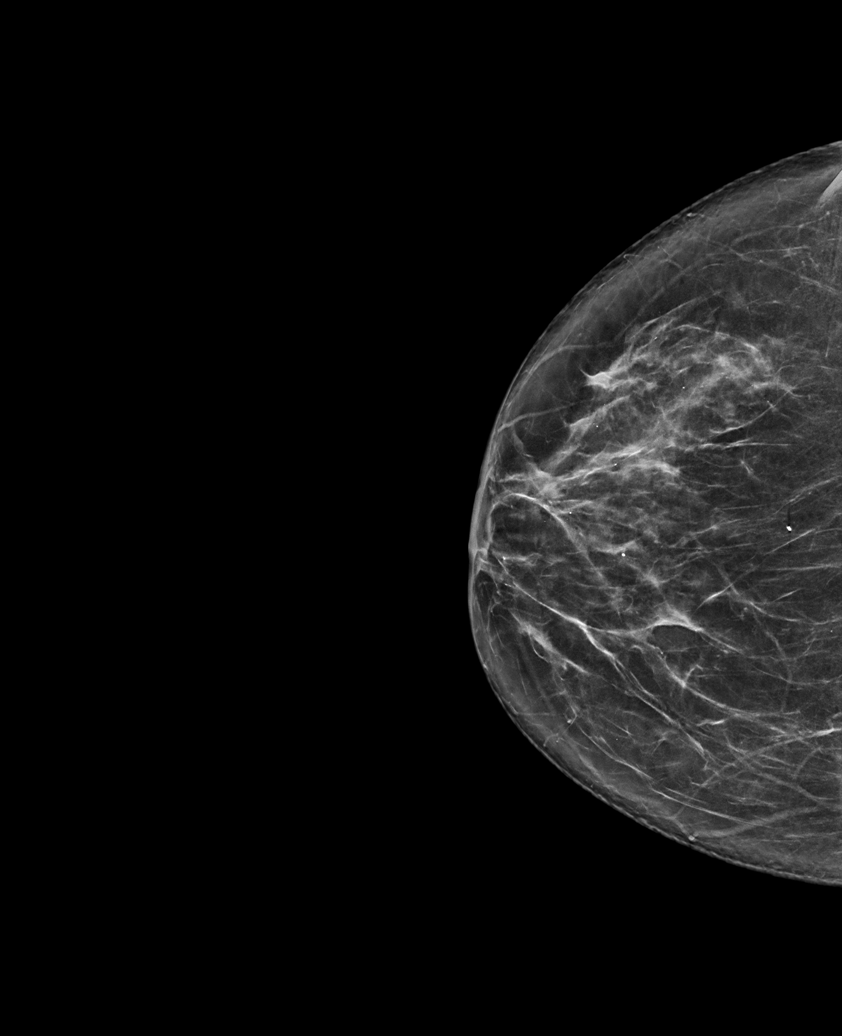

[R XCCL synth-2D]
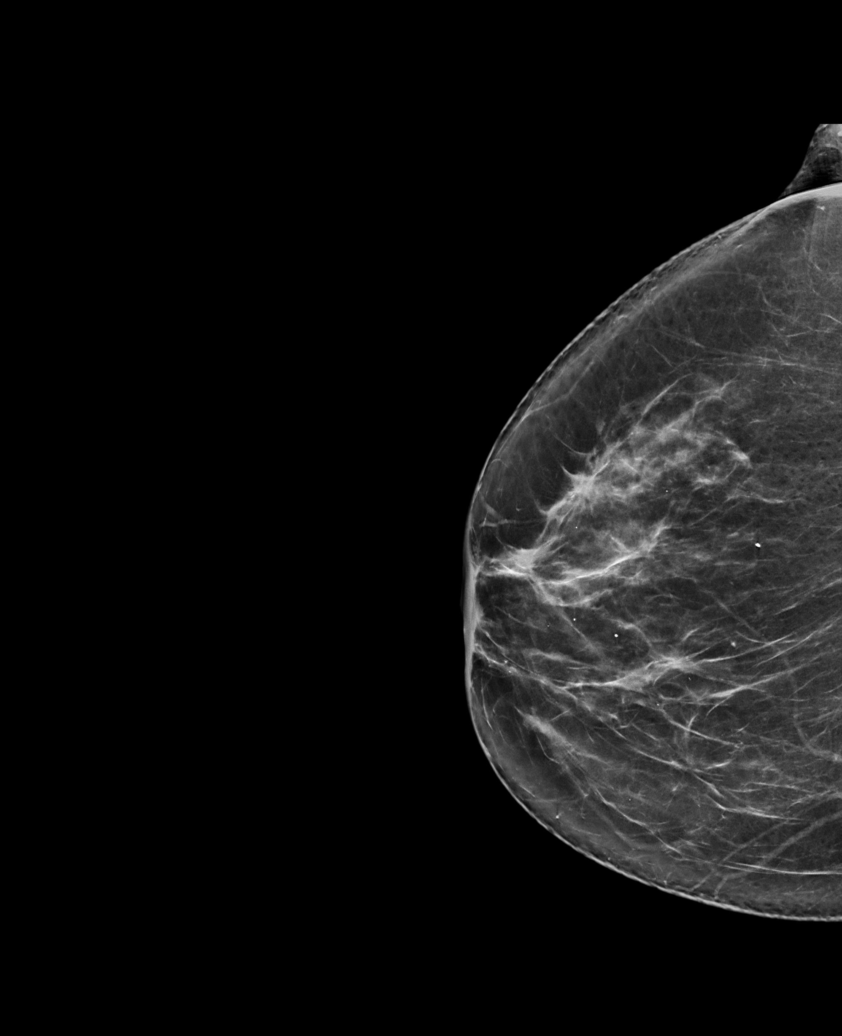

[R MLO synth-2D]
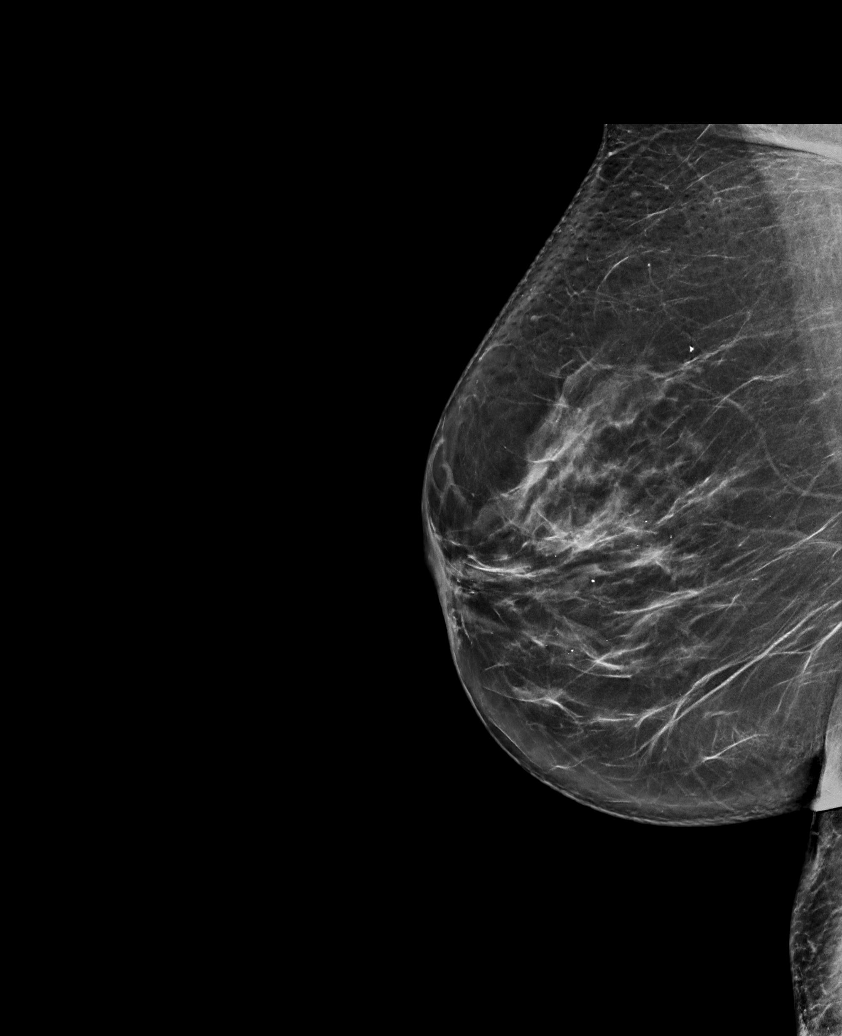

[R XCCL tomo · tomo slice 41/80.0]
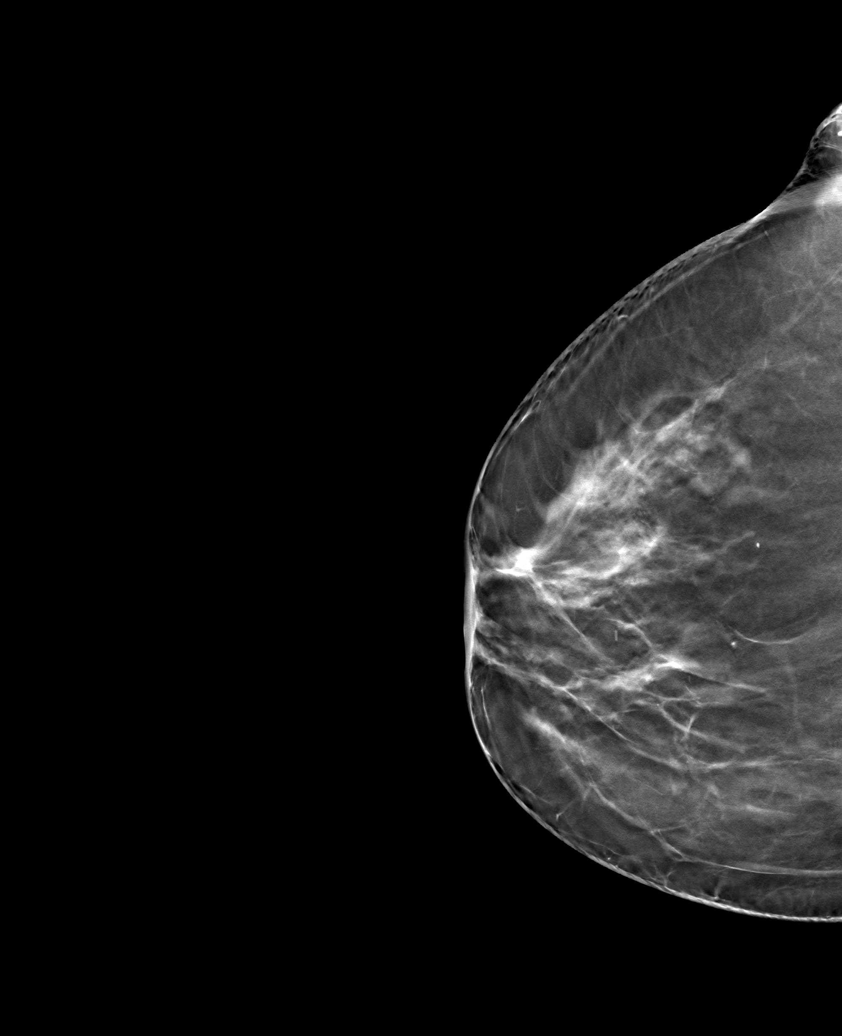

[6 of 30 positions shown; findings below may reference images not displayed]

ACR Breast Density Category b: There are scattered areas of
fibroglandular density.
FINDINGS: There are no findings suspicious for malignancy.
IMPRESSION: No mammographic evidence of malignancy. A result letter of this
screening mammogram will be mailed directly to the patient.

RECOMMENDATION:
Screening mammogram in one year. (Code:51-O-LD2)

BI-RADS CATEGORY  1: Negative.
